# Patient Record
Sex: Female | Born: 1965 | Race: White | Hispanic: No | Marital: Married | Smoking: Never smoker
Health system: Southern US, Community
[De-identification: ages and names within clinical notes are randomized; demographics above are authoritative.]

## PROBLEM LIST (undated history)

## (undated) DIAGNOSIS — E785 Hyperlipidemia, unspecified: Secondary | ICD-10-CM

## (undated) DIAGNOSIS — J309 Allergic rhinitis, unspecified: Secondary | ICD-10-CM

## (undated) DIAGNOSIS — Z8619 Personal history of other infectious and parasitic diseases: Secondary | ICD-10-CM

## (undated) DIAGNOSIS — K5792 Diverticulitis of intestine, part unspecified, without perforation or abscess without bleeding: Secondary | ICD-10-CM

## (undated) DIAGNOSIS — K635 Polyp of colon: Secondary | ICD-10-CM

## (undated) DIAGNOSIS — T7840XA Allergy, unspecified, initial encounter: Secondary | ICD-10-CM

## (undated) HISTORY — DX: Allergic rhinitis, unspecified: J30.9

## (undated) HISTORY — DX: Allergy, unspecified, initial encounter: T78.40XA

## (undated) HISTORY — DX: Hyperlipidemia, unspecified: E78.5

## (undated) HISTORY — PX: VASCULAR SURGERY: SHX849

## (undated) HISTORY — DX: Diverticulitis of intestine, part unspecified, without perforation or abscess without bleeding: K57.92

## (undated) HISTORY — DX: Personal history of other infectious and parasitic diseases: Z86.19

## (undated) HISTORY — DX: Polyp of colon: K63.5

---

## 2016-06-17 ENCOUNTER — Other Ambulatory Visit: Payer: Self-pay | Admitting: Obstetrics and Gynecology

## 2016-06-17 DIAGNOSIS — R928 Other abnormal and inconclusive findings on diagnostic imaging of breast: Secondary | ICD-10-CM

## 2016-06-23 ENCOUNTER — Ambulatory Visit
Admission: RE | Admit: 2016-06-23 | Discharge: 2016-06-23 | Disposition: A | Discharge status: Home / Self Care | Payer: Managed Care, Other (non HMO) | Source: Ambulatory Visit | Attending: Obstetrics and Gynecology | Admitting: Obstetrics and Gynecology

## 2016-06-23 DIAGNOSIS — R928 Other abnormal and inconclusive findings on diagnostic imaging of breast: Secondary | ICD-10-CM

## 2016-09-29 HISTORY — PX: COLONOSCOPY: SHX174

## 2017-06-16 LAB — HM PAP SMEAR: HM Pap smear: NEGATIVE

## 2017-06-17 LAB — HM MAMMOGRAPHY

## 2017-08-02 LAB — HEMOGLOBIN A1C: HEMOGLOBIN A1C: 5.1

## 2017-08-02 LAB — LIPID PANEL
Cholesterol: 278 — AB (ref 0–200)
HDL: 83 — AB (ref 35–70)
LDL CALC: 165
TRIGLYCERIDES: 170 — AB (ref 40–160)

## 2017-08-02 LAB — TSH: TSH: 2.81 (ref 0.41–5.90)

## 2017-08-02 LAB — VITAMIN D 25 HYDROXY (VIT D DEFICIENCY, FRACTURES): Vit D, 25-Hydroxy: 38

## 2017-08-26 ENCOUNTER — Encounter: Payer: Self-pay | Admitting: Emergency Medicine

## 2017-09-02 ENCOUNTER — Encounter: Payer: Self-pay | Admitting: Family Medicine

## 2017-09-02 ENCOUNTER — Ambulatory Visit (INDEPENDENT_AMBULATORY_CARE_PROVIDER_SITE_OTHER): Payer: 59 | Admitting: Family Medicine

## 2017-09-02 ENCOUNTER — Other Ambulatory Visit: Payer: Self-pay

## 2017-09-02 VITALS — BP 128/70 | HR 84 | Temp 98.4°F | Ht 65.0 in | Wt 158.0 lb

## 2017-09-02 DIAGNOSIS — S01339A Puncture wound without foreign body of unspecified ear, initial encounter: Secondary | ICD-10-CM | POA: Diagnosis not present

## 2017-09-02 DIAGNOSIS — D6851 Activated protein C resistance: Secondary | ICD-10-CM | POA: Insufficient documentation

## 2017-09-02 DIAGNOSIS — J309 Allergic rhinitis, unspecified: Secondary | ICD-10-CM | POA: Insufficient documentation

## 2017-09-02 DIAGNOSIS — I8390 Asymptomatic varicose veins of unspecified lower extremity: Secondary | ICD-10-CM | POA: Insufficient documentation

## 2017-09-02 DIAGNOSIS — K5790 Diverticulosis of intestine, part unspecified, without perforation or abscess without bleeding: Secondary | ICD-10-CM | POA: Insufficient documentation

## 2017-09-02 DIAGNOSIS — D126 Benign neoplasm of colon, unspecified: Secondary | ICD-10-CM | POA: Insufficient documentation

## 2017-09-02 DIAGNOSIS — K5730 Diverticulosis of large intestine without perforation or abscess without bleeding: Secondary | ICD-10-CM | POA: Diagnosis not present

## 2017-09-02 DIAGNOSIS — Z23 Encounter for immunization: Secondary | ICD-10-CM

## 2017-09-02 DIAGNOSIS — I8312 Varicose veins of left lower extremity with inflammation: Secondary | ICD-10-CM

## 2017-09-02 DIAGNOSIS — R7989 Other specified abnormal findings of blood chemistry: Secondary | ICD-10-CM

## 2017-09-02 HISTORY — DX: Allergic rhinitis, unspecified: J30.9

## 2017-09-02 NOTE — Patient Instructions (Signed)
Please return in 6 months to 1 year for a complete physical.  It was a pleasure meeting you today! Thank you for choosing [place] to meet your healthcare needs! I truly look forward to working with you. If you have any questions or concerns, please send me a message via Mychart or call the office at [phone].

## 2017-09-02 NOTE — Progress Notes (Signed)
Subjective  CC:  Chief Complaint  Patient presents with  . Establish Care    Relocated here from [place]   . Hyperlipidemia    HPI: Sarah Lindsey is a 52 y.o. female who presents to [place] [facility] today to establish care with me as a new patient. Originally from [place]; moved here from [place] 1/5 years ago. Likes it. Husband's job brought him here. She works full time from home and is mother of 3 pre teens. (11, 12 and 13y). Overall healthy but we discussed her PMH in detail. Also needs help with ear piercing - holes are too big.   She has the following concerns or needs:   Hyperlipidemia f/u: Patient presents for follow up of lipids. Had CPE with Dr. Orvan Seen in January; I reviewed notes and all labs. Lipids showed high hdl, ldl 165 and elevated cholesterol. Here for consultation. No cardiovascular risk factors. Diet is mostly health but skips breakfast and has had weight gain over the last year.   Active allergies: chronic allergies on meds. No sxs of infection. Hasn't been to allergist.   Weight gain: reports has gained about 20 pounds in last year. We discussed diet, exercise recs. Nl TSH recently. Discussed importance of sleep - she is chronically sleep deprived as well. No mood concerns. Happy but a little bored with the transition from [place] to [place]. Very active with full time work and 3 active children. 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   Reviewed recent colonoscopy findings and need for increased crc surveillance with dr. Collene Mares.    The 10-year ASCVD risk score Mikey Bussing DC Brooke Bonito., et al., 2013) is: 1.3%*   Values used to calculate the score:     Age: 19 years     Sex: Female     Is Non-Hispanic African American: No     Diabetic: No     Tobacco smoker: No     Systolic Blood Pressure: 998 mmHg     Is BP treated: No     HDL Cholesterol: 83 mg/dL*     Total Cholesterol: 278 mg/dL*     * - Cholesterol units were assumed for this score  calculation   We updated and reviewed the patient's past history in detail and it is documented below.  Patient Active Problem List   Diagnosis Date Noted  . Factor V Leiden carrier ([place]) 09/02/2017  . Varicose vein of leg 09/02/2017  . Colon adenomas 09/02/2017  . Diverticulosis 09/02/2017  . Chronic allergic rhinitis 09/02/2017   Health Maintenance  Topic Date Due  . HIV Screening  03/06/1981  . TETANUS/TDAP  03/06/1985  . MAMMOGRAM  06/18/2019  . COLONOSCOPY  09/08/2019  . PAP SMEAR  06/16/2022  . INFLUENZA VACCINE  Completed   Immunization History  Administered Date(s) Administered  . Tdap 09/02/2017   Current Meds  Medication Sig  . azelastine (ASTELIN) 0.1 % nasal spray   . cholecalciferol (VITAMIN D) 1000 units tablet Take 200 Units by mouth daily.  . Multiple Vitamin (MULTIVITAMIN) tablet Take 1 tablet by mouth daily.    Allergies: Patient has No Known Allergies. Past Medical History Patient  has a past medical history of Allergy, Chronic allergic rhinitis (09/02/2017), Colon polyps, Diverticulitis, History of chicken pox, and Hyperlipidemia. Past Surgical History Patient  has a past surgical history that includes Vascular surgery. Family History: Patient family history includes Alcohol abuse in her maternal grandfather; Breast cancer in her mother; Cirrhosis in  her maternal grandfather; Colon cancer (age of onset: 63) in her paternal grandfather; Healthy in her daughter, daughter, and sister; Hodgkin's lymphoma in her brother; Hyperlipidemia in her maternal grandmother and mother; Irritable bowel syndrome in her son; Lung cancer in her father; Stroke in her maternal grandmother. Social History:  Patient  reports that  has never smoked. she has never used smokeless tobacco. She reports that she drinks alcohol. She reports that she does not use drugs.  Review of Systems: Constitutional: negative for fever or malaise Ophthalmic: negative for photophobia, double  vision or loss of vision Cardiovascular: negative for chest pain, dyspnea on exertion, or new LE swelling Respiratory: negative for SOB or persistent cough Gastrointestinal: negative for abdominal pain, change in bowel habits or melena Genitourinary: negative for dysuria or gross hematuria Musculoskeletal: negative for new gait disturbance or muscular weakness Integumentary: negative for new or persistent rashes Neurological: negative for TIA or stroke symptoms Psychiatric: negative for SI or delusions Allergic/Immunologic: negative for hives  Patient Care Team    Relationship Specialty Notifications Start End  Leamon Arnt, MD PCP - General Family Medicine  09/02/17     Objective  Vitals: BP 128/70   Pulse 84   Temp 98.4 F (36.9 C)   Ht 5\' 5"  (1.651 m)   Wt 158 lb (71.7 kg)   BMI 26.29 kg/m  General:  Well developed, well nourished, no acute distress  Psych:  Alert and oriented,normal mood and affect HEENT:  Normocephalic, atraumatic, non-icteric sclera, PERRL, oropharynx is without mass or exudate, supple neck without adenopathy, mass or thyromegaly Cardiovascular:  RRR without gallop, rub or murmur, nondisplaced PMI Respiratory:  Good breath sounds bilaterally, CTAB with normal respiratory effort Gastrointestinal: normal bowel sounds, soft, non-tender, no noted masses. No HSM MSK: no deformities, contusions. Joints are without erythema or swelling Skin:  Warm, no rashes or suspicious lesions noted Neurologic:    Mental status is normal. Gross motor and sensory exams are normal. Normal gait  Assessment  1. High serum high density lipoprotein (HDL)   2. Factor V Leiden carrier ([place])   3. Varicose veins of left lower extremity with inflammation   4. Colon adenomas   5. Diverticulosis of large intestine without hemorrhage   6. Complication of ear piercing, unspecified laterality, initial encounter   7. Chronic allergic rhinitis      Plan   Lipids: reassured. Low CVD.  rec heart healthy diet, weight loss and exercise program. No need for cholesterol lowering medication at this time. Will foloow.   Rec allergy meds daily and f/u if needs more help.   Overweight: had long discussion regarding eating recommendations, and exercise/strengthening recs. Also healthy sleep needs.   Refer to plastics to repair ear lobes.   Follow up:  Return in about 6 months (around 03/02/2018) for complete physical.  Commons side effects, risks, benefits, and alternatives for medications and treatment plan prescribed today were discussed, and the patient expressed understanding of the given instructions. Patient is instructed to call or message via MyChart if he/she has any questions or concerns regarding our treatment plan. No barriers to understanding were identified. We discussed Red Flag symptoms and signs in detail. Patient expressed understanding regarding what to do in case of urgent or emergency type symptoms.   Medication list was reconciled, printed and provided to the patient in AVS. Patient instructions and summary information was reviewed with the patient as documented in the AVS. This note was prepared with assistance of Dragon voice recognition  software. Occasional wrong-word or sound-a-like substitutions may have occurred due to the inherent limitations of voice recognition software  Orders Placed This Encounter  Procedures  . Tdap vaccine greater than or equal to 7yo IM  . Ambulatory referral to Plastic Surgery   No orders of the defined types were placed in this encounter.

## 2018-05-11 ENCOUNTER — Ambulatory Visit (INDEPENDENT_AMBULATORY_CARE_PROVIDER_SITE_OTHER): Payer: 59 | Admitting: Family Medicine

## 2018-05-11 ENCOUNTER — Encounter: Payer: Self-pay | Admitting: Family Medicine

## 2018-05-11 ENCOUNTER — Other Ambulatory Visit: Payer: Self-pay

## 2018-05-11 VITALS — BP 128/82 | HR 79 | Temp 98.1°F | Resp 16 | Ht 64.5 in | Wt 158.5 lb

## 2018-05-11 DIAGNOSIS — M79642 Pain in left hand: Secondary | ICD-10-CM | POA: Diagnosis not present

## 2018-05-11 DIAGNOSIS — R140 Abdominal distension (gaseous): Secondary | ICD-10-CM | POA: Diagnosis not present

## 2018-05-11 DIAGNOSIS — M79641 Pain in right hand: Secondary | ICD-10-CM | POA: Diagnosis not present

## 2018-05-11 DIAGNOSIS — R072 Precordial pain: Secondary | ICD-10-CM

## 2018-05-11 LAB — CBC WITH DIFFERENTIAL/PLATELET
BASOS PCT: 0.9 % (ref 0.0–3.0)
Basophils Absolute: 0 10*3/uL (ref 0.0–0.1)
EOS PCT: 1.4 % (ref 0.0–5.0)
Eosinophils Absolute: 0.1 10*3/uL (ref 0.0–0.7)
HEMATOCRIT: 42.2 % (ref 36.0–46.0)
HEMOGLOBIN: 14.6 g/dL (ref 12.0–15.0)
LYMPHS PCT: 38.9 % (ref 12.0–46.0)
Lymphs Abs: 1.8 10*3/uL (ref 0.7–4.0)
MCHC: 34.6 g/dL (ref 30.0–36.0)
MCV: 96.8 fl (ref 78.0–100.0)
MONOS PCT: 5.4 % (ref 3.0–12.0)
Monocytes Absolute: 0.3 10*3/uL (ref 0.1–1.0)
NEUTROS PCT: 53.4 % (ref 43.0–77.0)
Neutro Abs: 2.5 10*3/uL (ref 1.4–7.7)
PLATELETS: 246 10*3/uL (ref 150.0–400.0)
RBC: 4.36 Mil/uL (ref 3.87–5.11)
RDW: 12.4 % (ref 11.5–15.5)
WBC: 4.6 10*3/uL (ref 4.0–10.5)

## 2018-05-11 LAB — LIPASE: Lipase: 2 U/L — ABNORMAL LOW (ref 11.0–59.0)

## 2018-05-11 LAB — COMPREHENSIVE METABOLIC PANEL
ALT: 30 U/L (ref 0–35)
AST: 20 U/L (ref 0–37)
Albumin: 4.5 g/dL (ref 3.5–5.2)
Alkaline Phosphatase: 74 U/L (ref 39–117)
BILIRUBIN TOTAL: 0.4 mg/dL (ref 0.2–1.2)
BUN: 14 mg/dL (ref 6–23)
CO2: 26 mEq/L (ref 19–32)
CREATININE: 0.73 mg/dL (ref 0.40–1.20)
Calcium: 9.9 mg/dL (ref 8.4–10.5)
Chloride: 102 mEq/L (ref 96–112)
GFR: 88.92 mL/min (ref >60.00)
GLUCOSE: 102 mg/dL — AB (ref 70–99)
Potassium: 4.2 mEq/L (ref 3.5–5.1)
SODIUM: 137 meq/L (ref 135–145)
TOTAL PROTEIN: 7.1 g/dL (ref 6.0–8.3)

## 2018-05-11 LAB — SEDIMENTATION RATE: Sed Rate: 10 mm/hr (ref 0–30)

## 2018-05-11 LAB — TSH: TSH: 1.92 u[IU]/mL (ref 0.35–4.50)

## 2018-05-11 MED ORDER — HYOSCYAMINE SULFATE SL 0.125 MG SL SUBL: 0.1250 | SUBLINGUAL_TABLET | Freq: Three times a day (TID) | SUBLINGUAL | 1 refills | Status: AC

## 2018-05-11 NOTE — Progress Notes (Signed)
Subjective  CC:  Chief Complaint  Patient presents with  . Bloated    pt says it is constant, if she stands up fast has pain from upper sternum to mid abd  . Arthritis    pt brought meds from other provider    HPI: Sarah Lindsey is a 52 y.o. female who presents to the office today to address the problems listed above in the chief complaint.  52 year old postmenopausal female here for persistent abdominal bloating that she says is pretty much constant.  Started over about a year ago.  Has times where she feels bloated in her stomach is firm.  She is concerned that there is something wrong internally.  She reports bowel movements to be at her usual which are typically daily sometimes hard and firm.  She denies loose stools, melena, nausea, vomiting, weight changes, fevers, chills.  Appetite is normal.  She lives a busy lifestyle.  Mother of 3 teenagers and works full-time.  She denies persistent pain, however she had 2 episodes where she had sharp fleeting pain substernally down to her navel.  This was usually when she was standing from a seated position.  She is worried that something is wrong.  No history of IBS.  Had recent colonoscopy with adenomas, 2018.  Sees Dr. Collene Mares.  Hand pain, intermittent and joint related.  Prior PCP diagnosed osteoarthritis and treated with diclofenac gel.  She used this for the first time last week.  Did help.  She denies red hot swollen joints.  No other joint pain. Assessment  1. Abdominal bloating   2. Bilateral hand pain   3. Substernal chest pain      Plan   Abdominal bloating with fleeting pain: Question IBS.  Check KUB.  Recommend Gas-X, Levsin, consideration of MiraLAX and probiotic.  If cannot get improved and blood work is normal, will refer back to GI.  Hand pain: Check x-rays, most likely osteoarthritis.  Diclofenac gel is fine to use.  Can also use Advil and/or Tylenol as needed.  Follow up: Return if symptoms worsen or fail to improve.    Orders Placed This Encounter  Procedures  . DG Chest 2 View  . DG Abd 1 View  . DG Hand 2 View Left  . DG Hand 2 View Right  . CBC with Differential/Platelet  . Comprehensive metabolic panel  . Lipase  . Sedimentation rate  . TSH   Meds ordered this encounter  Medications  . Hyoscyamine Sulfate SL (LEVSIN/SL) 0.125 MG SUBL    Sig: Place 0.125 tablets under the tongue 4 (four) times daily -  before meals and at bedtime.    Dispense:  30 each    Refill:  1      I reviewed the patients updated PMH, FH, and SocHx.    Patient Active Problem List   Diagnosis Date Noted  . Factor V Leiden carrier ([place]) 09/02/2017  . Varicose vein of leg 09/02/2017  . Colon adenomas 09/02/2017  . Diverticulosis 09/02/2017  . Chronic allergic rhinitis 09/02/2017   Current Meds  Medication Sig  . azelastine (ASTELIN) 0.1 % nasal spray   . cholecalciferol (VITAMIN D) 1000 units tablet Take 200 Units by mouth daily.  . Diclofenac Sodium 1.5 % SOLN Place onto the skin.  . Multiple Vitamin (MULTIVITAMIN) tablet Take 1 tablet by mouth daily.    Allergies: Patient has No Known Allergies. Family History: Patient family history includes Alcohol abuse in her maternal grandfather; Breast cancer in her  mother; Cirrhosis in her maternal grandfather; Colon cancer (age of onset: 4) in her paternal grandfather; Healthy in her daughter, daughter, and sister; Hodgkin's lymphoma in her brother; Hyperlipidemia in her maternal grandmother and mother; Irritable bowel syndrome in her son; Lung cancer in her father; Stroke in her maternal grandmother. Social History:  Patient  reports that she has never smoked. She has never used smokeless tobacco. She reports that she drinks alcohol. She reports that she does not use drugs.  Review of Systems: Constitutional: Negative for fever malaise or anorexia Cardiovascular: negative for chest pain Respiratory: negative for SOB or persistent cough Gastrointestinal:  negative for abdominal pain  Wt Readings from Last 3 Encounters:  05/11/18 158 lb 8 oz (71.9 kg)  09/02/17 158 lb (71.7 kg)   Objective  Vitals: BP 128/82   Pulse 79   Temp 98.1 F (36.7 C) (Oral)   Resp 16   Ht 5' 4.5" (1.638 m)   Wt 158 lb 8 oz (71.9 kg)   SpO2 98%   BMI 26.79 kg/m  General: no acute distress , A&Ox3 HEENT: PEERL, conjunctiva normal, Oropharynx moist,neck is supple Cardiovascular:  RRR without murmur or gallop.  Respiratory:  Good breath sounds bilaterally, CTAB with normal respiratory effort Skin:  Warm, no rashes Gastrointestinal: soft, flat abdomen, normal active bowel sounds, no palpable masses, no hepatosplenomegaly, no appreciated hernias Joints: Normal appearing, nontender.  Does have a few mild osteophytic changes in DIPs bilateral hands, normal grip strength     Commons side effects, risks, benefits, and alternatives for medications and treatment plan prescribed today were discussed, and the patient expressed understanding of the given instructions. Patient is instructed to call or message via MyChart if he/she has any questions or concerns regarding our treatment plan. No barriers to understanding were identified. We discussed Red Flag symptoms and signs in detail. Patient expressed understanding regarding what to do in case of urgent or emergency type symptoms.   Medication list was reconciled, printed and provided to the patient in AVS. Patient instructions and summary information was reviewed with the patient as documented in the AVS. This note was prepared with assistance of Dragon voice recognition software. Occasional wrong-word or sound-a-like substitutions may have occurred due to the inherent limitations of voice recognition software

## 2018-05-11 NOTE — Patient Instructions (Addendum)
Please return January or February for your annual complete physical; please come fasting.  Start using Gas X and the levsin as needed for abdominal bloating to see if this helps.  Try a months worth of an otc probiotic.   I will release your lab results to you on your MyChart account with further instructions. Please reply with any questions.  Please go to our [facility] office to get your xrays done. You can walk in M-F between 8am and 5pm. Tell them you are there for xrays ordered by me. They will send me the results, then I will let you know the results with instructions.   Address: [place], [place], [place]  (office sits at [address] at [address] intersection; from here, turn left onto [place] [address]), take to [address], turn right and go for a mile or so, office will be on left across form [employer] )   If you have any questions or concerns, please don't hesitate to send me a message via MyChart or call the office at [phone]. Thank you for visiting with [place] today! It's our pleasure caring for you.   Irritable Bowel Syndrome, Adult Irritable bowel syndrome (IBS) is not one specific disease. It is a group of symptoms that affects the organs responsible for digestion (gastrointestinal or GI tract). To regulate how your GI tract works, your body sends signals back and forth between your intestines and your brain. If you have IBS, there may be a problem with these signals. As a result, your GI tract does not function normally. Your intestines may become more sensitive and overreact to certain things. This is especially true when you eat certain foods or when you are under stress. There are four types of IBS. These may be determined based on the consistency of your stool:  IBS with diarrhea.  IBS with constipation.  Mixed IBS.  Unsubtyped IBS.  It is important to know which type of IBS you have. Some  treatments are more likely to be helpful for certain types of IBS. What are the causes? The exact cause of IBS is not known. What increases the risk? You may have a higher risk of IBS if:  You are a woman.  You are younger than 52 years old.  You have a family history of IBS.  You have mental health problems.  You have had bacterial infection of your GI tract.  What are the signs or symptoms? Symptoms of IBS vary from person to person. The main symptom is abdominal pain or discomfort. Additional symptoms usually include one or more of the following:  Diarrhea, constipation, or both.  Abdominal swelling or bloating.  Feeling full or sick after eating a small or regular-size meal.  Frequent gas.  Mucus in the stool.  A feeling of having more stool left after a bowel movement.  Symptoms tend to come and go. They may be associated with stress, psychiatric conditions, or nothing at all. How is this diagnosed? There is no specific test to diagnose IBS. Your health care provider will make a diagnosis based on a physical exam, medical history, and your symptoms. You may have other tests to rule out other conditions that may be causing your symptoms. These may include:  Blood tests.  X-rays.  CT scan.  Endoscopy and colonoscopy. This is a test in which your GI tract is viewed with a long, thin, flexible tube.  How is this treated?  There is no cure for IBS, but treatment can help relieve symptoms. IBS treatment often includes:  Changes to your diet, such as: ? Eating more fiber. ? Avoiding foods that cause symptoms. ? Drinking more water. ? Eating regular, medium-sized portioned meals.  Medicines. These may include: ? Fiber supplements if you have constipation. ? Medicine to control diarrhea (antidiarrheal medicines). ? Medicine to help control muscle spasms in your GI tract (antispasmodic medicines). ? Medicines to help with any mental health issues, such as  antidepressants or tranquilizers.  Therapy. ? Talk therapy may help with anxiety, depression, or other mental health issues that can make IBS symptoms worse.  Stress reduction. ? Managing your stress can help keep symptoms under control.  Follow these instructions at home:  Take medicines only as directed by your health care provider.  Eat a healthy diet. ? Avoid foods and drinks with added sugar. ? Include more whole grains, fruits, and vegetables gradually into your diet. This may be especially helpful if you have IBS with constipation. ? Avoid any foods and drinks that make your symptoms worse. These may include dairy products and caffeinated or carbonated drinks. ? Do not eat large meals. ? Drink enough fluid to keep your urine clear or pale yellow.  Exercise regularly. Ask your health care provider for recommendations of good activities for you.  Keep all follow-up visits as directed by your health care provider. This is important. Contact a health care provider if:  You have constant pain.  You have trouble or pain with swallowing.  You have worsening diarrhea. Get help right away if:  You have severe and worsening abdominal pain.  You have diarrhea and: ? You have a rash, stiff neck, or severe headache. ? You are irritable, sleepy, or difficult to awaken. ? You are weak, dizzy, or extremely thirsty.  You have bright red blood in your stool or you have black tarry stools.  You have unusual abdominal swelling that is painful.  You vomit continuously.  You vomit blood (hematemesis).  You have both abdominal pain and a fever. This information is not intended to replace advice given to you by your health care provider. Make sure you discuss any questions you have with your health care provider. Document Released: 07/05/2005 Document Revised: 12/05/2015 Document Reviewed: 03/22/2014 Elsevier Interactive Patient Education  2018 [employer].

## 2018-05-12 NOTE — Progress Notes (Signed)
Please call patient: I have reviewed his/her lab results. All blood test results are normal... Awaiting xray results.

## 2018-05-17 ENCOUNTER — Ambulatory Visit (INDEPENDENT_AMBULATORY_CARE_PROVIDER_SITE_OTHER): Payer: 59

## 2018-05-17 DIAGNOSIS — R072 Precordial pain: Secondary | ICD-10-CM | POA: Diagnosis not present

## 2018-05-17 DIAGNOSIS — M79642 Pain in left hand: Secondary | ICD-10-CM | POA: Diagnosis not present

## 2018-05-17 DIAGNOSIS — R140 Abdominal distension (gaseous): Secondary | ICD-10-CM

## 2018-05-17 DIAGNOSIS — M79641 Pain in right hand: Secondary | ICD-10-CM | POA: Diagnosis not present

## 2018-05-18 NOTE — Progress Notes (Signed)
Please call patient: I have reviewed his/her xray results. All xrays look great. They are normal. Follow instructions given at office visit and return if not improving or worsening. IF GI sxs don't improve, rec seeing Dr. Collene Mares again.

## 2018-07-11 ENCOUNTER — Encounter: Payer: Self-pay | Admitting: *Deleted

## 2019-09-21 IMAGING — DX DG CHEST 2V
2 series · 2 of 2 positions shown · non-contrast
Comparison: None

CLINICAL DATA: Chest pain for 1 year.

EXAM:
CHEST - 2 VIEW

[chest pa]
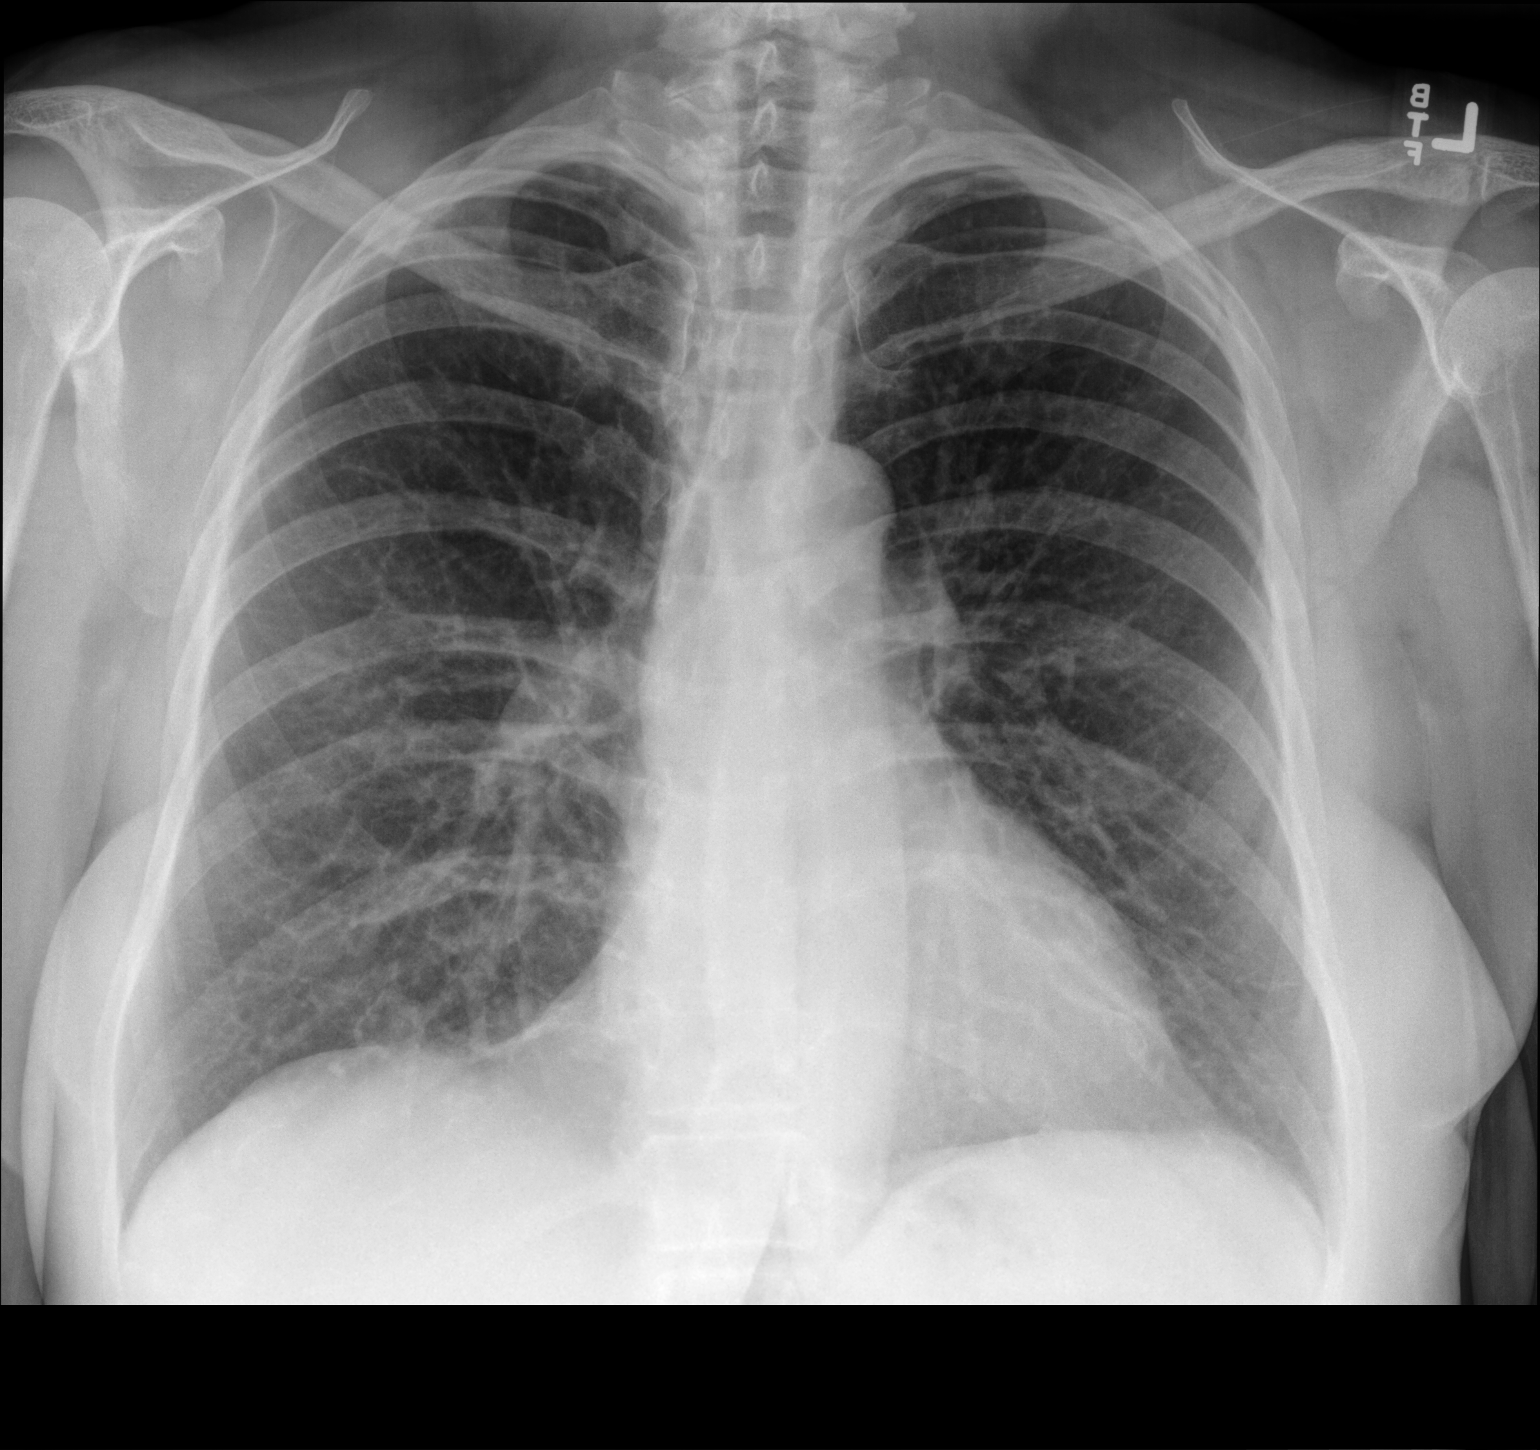

[chest lat]
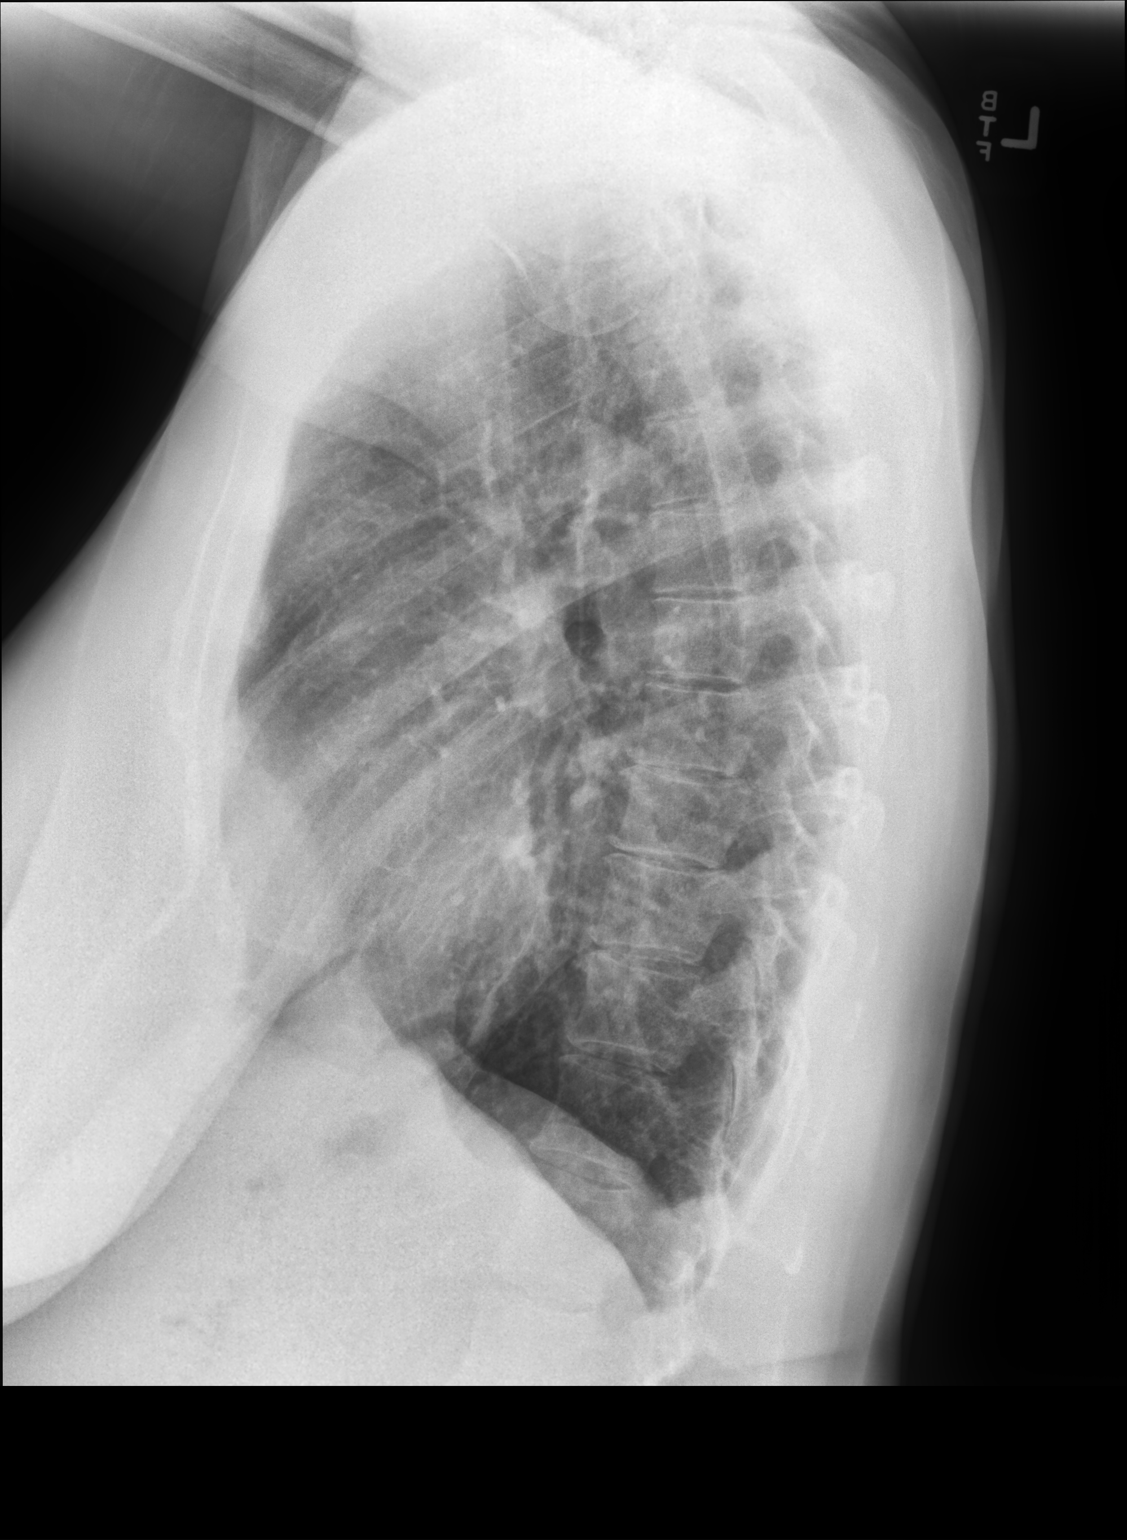

[2 of 2 positions shown; findings below may reference images not displayed]

FINDINGS: The cardiomediastinal silhouette is unremarkable.

There is no evidence of focal airspace disease, pulmonary edema,
suspicious pulmonary nodule/mass, pleural effusion, or pneumothorax.

No acute bony abnormalities are identified.
IMPRESSION: No active cardiopulmonary disease.

## 2019-09-21 IMAGING — DX DG ABDOMEN 1V
1 series · 1 of 1 positions shown · non-contrast
Comparison: None.

CLINICAL DATA: Chronic abdominal pain for 1 year.

EXAM:
ABDOMEN - 1 VIEW

[kub ap]
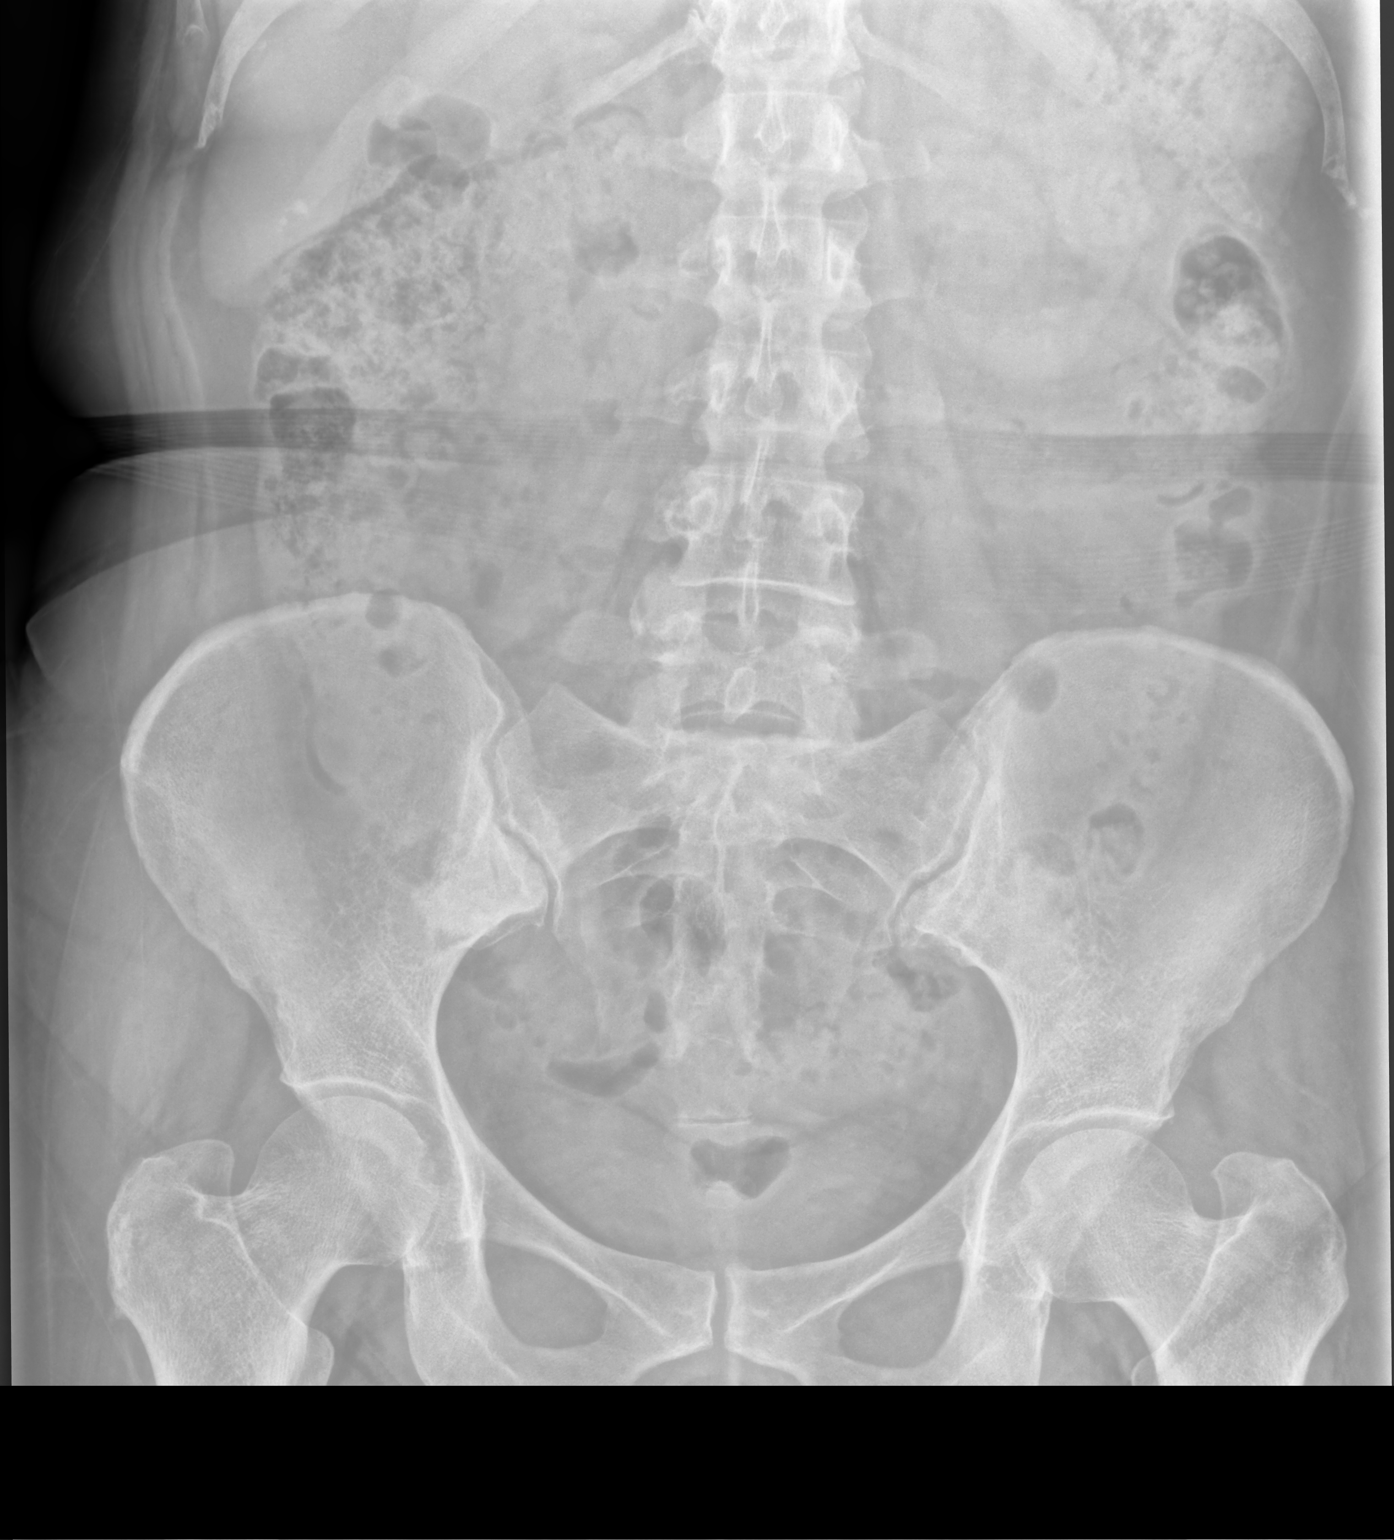

[1 of 1 positions shown; findings below may reference images not displayed]

FINDINGS: The bowel gas pattern is normal. No radio-opaque calculi or other
significant radiographic abnormality are seen.
IMPRESSION: Negative.

## 2019-09-21 IMAGING — DX DG HAND 2V*R*
2 series · 2 of 2 positions shown · non-contrast
Comparison: None.

CLINICAL DATA: Chronic RIGHT hand pain.

EXAM:
RIGHT HAND - 2 VIEW

[hand pa]
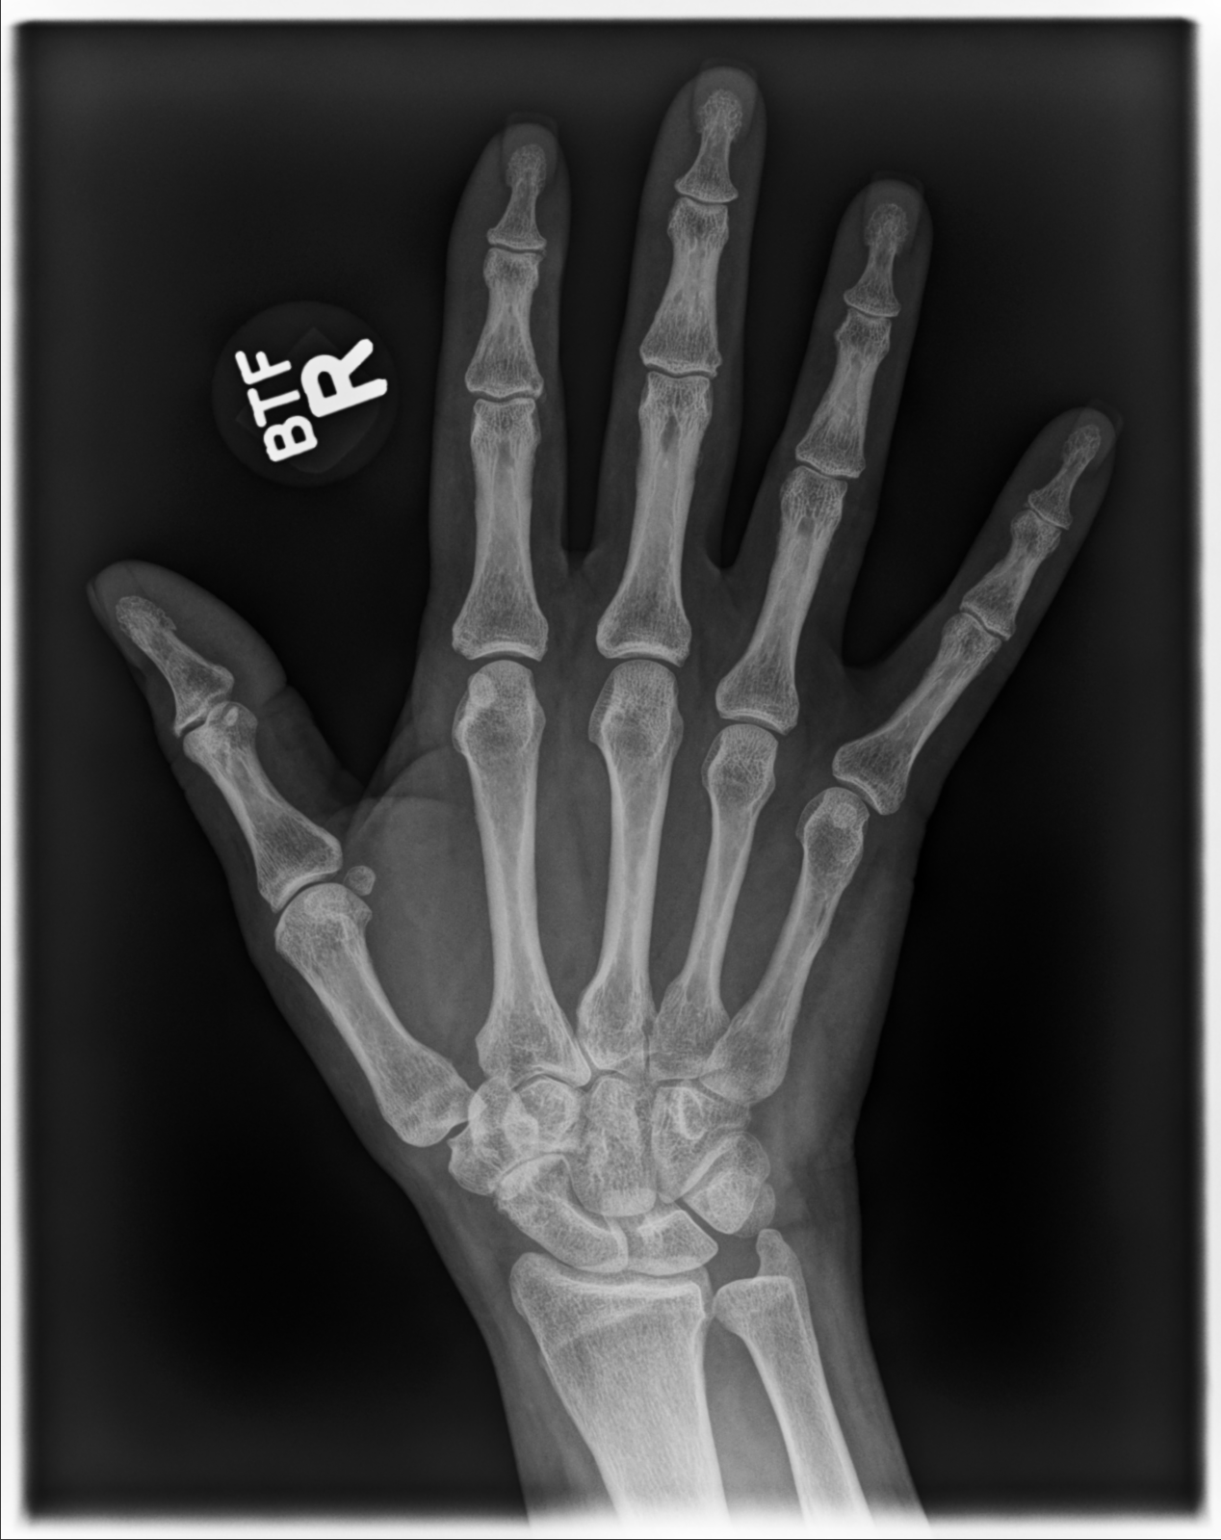

[hand lat]
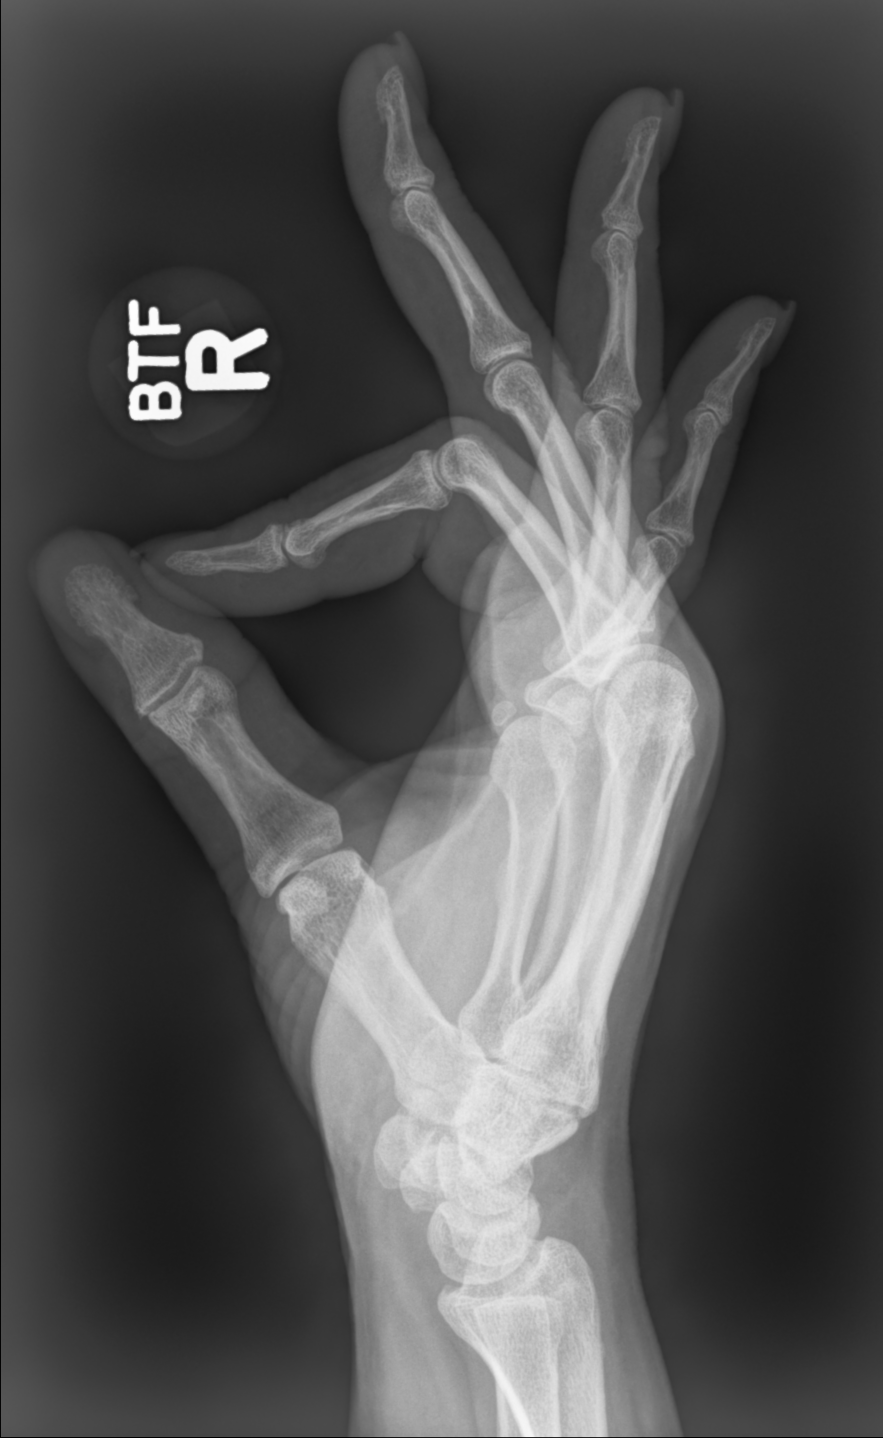

[2 of 2 positions shown; findings below may reference images not displayed]

FINDINGS: There is no evidence of fracture or dislocation. There is no
evidence of arthropathy or other focal bone abnormality. Soft
tissues are unremarkable.
IMPRESSION: Negative.
